# Patient Record
Sex: Female | Born: 1975 | Race: White | Hispanic: No | Marital: Married | Smoking: Never smoker
Health system: Southern US, Community
[De-identification: ages and names within clinical notes are randomized; demographics above are authoritative.]

## PROBLEM LIST (undated history)

## (undated) DIAGNOSIS — R51 Headache: Secondary | ICD-10-CM

## (undated) DIAGNOSIS — R519 Headache, unspecified: Secondary | ICD-10-CM

## (undated) HISTORY — DX: Headache, unspecified: R51.9

## (undated) HISTORY — DX: Headache: R51

---

## 2007-05-29 ENCOUNTER — Ambulatory Visit: Payer: Self-pay | Admitting: Internal Medicine

## 2007-11-27 ENCOUNTER — Ambulatory Visit: Payer: Self-pay | Admitting: Internal Medicine

## 2015-11-28 ENCOUNTER — Ambulatory Visit (INDEPENDENT_AMBULATORY_CARE_PROVIDER_SITE_OTHER): Payer: Managed Care, Other (non HMO) | Admitting: Primary Care

## 2015-11-28 ENCOUNTER — Encounter: Payer: Self-pay | Admitting: Primary Care

## 2015-11-28 ENCOUNTER — Telehealth: Payer: Self-pay | Admitting: Primary Care

## 2015-11-28 VITALS — BP 120/78 | HR 103 | Temp 98.3°F | Ht 68.0 in | Wt 162.8 lb

## 2015-11-28 DIAGNOSIS — J3489 Other specified disorders of nose and nasal sinuses: Secondary | ICD-10-CM | POA: Diagnosis not present

## 2015-11-28 DIAGNOSIS — R51 Headache: Secondary | ICD-10-CM | POA: Diagnosis not present

## 2015-11-28 DIAGNOSIS — R519 Headache, unspecified: Secondary | ICD-10-CM | POA: Insufficient documentation

## 2015-11-28 NOTE — Progress Notes (Signed)
Subjective:    Patient ID: Eileen Delgado, female    DOB: 02-21-1976, 40 y.o.   MRN: [id number]  HPI  Eileen Delgado is a 40 year old female who presents today to establish care and discuss the problems mentioned below. Will obtain old records.  1) Recurrent Sinusitis: Ongoing since February 2017. Symptoms initially began in February, improved, then returned in March without dissipation. Symptoms include postnasal drip, right sided jaw pain, sinus pain to right frontal and maxillary sinuses, and headaches.   She was evaluated in March 2017 at an urgent care and was provided with a prescription for levofloxacin. She never filled the left levofloxacin as she was reticent after reading the possible side effects. She was then provided with a prescription for Azithromycin for which she completed. Her symptoms returned 2 weeks later. She was then provided with a prescription for Cephalexin and developed rash, scabbing, and shortness of breath.   She's been taking Mucinex, nasal steroid spray, and sudafed with temporary improvement. Denies fevers, chills, nausea, fatigue. Her symptoms have been quite debilitating and have caused anxiety and stress. She is very frustrated with the lack of improvement as she has no prior history of sinus infections/pressure.  2) Frequency Headaches/Migraines: Present for the past 2 years. Will experience migraines during ovulation or several days prior to her menstrual cycle. Since February she's experienced increased headaches to her right temporal and frontal region. She will have occasional photophobia with nausea. Overall her migraines associated with her menstrual cycle are manageable.  Review of Systems  Constitutional: Negative for fever and chills.  HENT: Positive for congestion, postnasal drip and sinus pressure. Negative for ear pain and sore throat.   Respiratory: Negative for cough and shortness of breath.   Cardiovascular: Negative for chest pain.    Gastrointestinal: Negative for nausea.  Skin: Negative for rash.  Allergic/Immunologic: Negative for environmental allergies.  Neurological:       Occasional dizziness with headaches  Psychiatric/Behavioral: The patient is not nervous/anxious.        Past Medical History  Diagnosis Date  . Frequent headaches      Social History   Social History  . Marital Status: Married    Spouse Name: N/A  . Number of Children: N/A  . Years of Education: N/A   Occupational History  . Not on file.   Social History Main Topics  . Smoking status: Never Smoker   . Smokeless tobacco: Not on file  . Alcohol Use: No  . Drug Use: No  . Sexual Activity: Not on file   Other Topics Concern  . Not on file   Social History Narrative   Married   1 son.   Works from home.   Enjoys hiking, photography, blogging.    No past surgical history on file.  No family history on file.  Allergies  Allergen Reactions  . Cephalosporins Rash    Scabs, shortness of breath  . Penicillins Hives  . Phenergan [Promethazine]     No current outpatient prescriptions on file prior to visit.   No current facility-administered medications on file prior to visit.    BP 120/78 mmHg  Pulse 103  Temp(Src) 98.3 F (36.8 C) (Oral)  Ht 5\' 8"  (1.727 m)  Wt 162 lb 12.8 oz (73.846 kg)  BMI 24.76 kg/m2  SpO2 99%  LMP 11/24/2015    Objective:   Physical Exam  Constitutional: She appears well-nourished.  HENT:  Right Ear: Ear canal normal. Tympanic membrane is not  erythematous.  Left Ear: Ear canal normal. Tympanic membrane is not erythematous.  Nose: Mucosal edema present. Right sinus exhibits maxillary sinus tenderness and frontal sinus tenderness. Left sinus exhibits no maxillary sinus tenderness and no frontal sinus tenderness.  Mouth/Throat: Oropharynx is clear and moist.  Dullness to bilateral TM's.  Neck: Neck supple.  Cardiovascular: Normal rate and regular rhythm.   Pulmonary/Chest: Effort  normal and breath sounds normal.  Skin: Skin is warm and dry.  Psychiatric: She has a normal mood and affect.  Appears anxious about ongoing sinus pressure.          Assessment & Plan:

## 2015-11-28 NOTE — Patient Instructions (Signed)
Stop by the front desk and speak with either Shirlee LimerickMarion or Revonda StandardAllison regarding your CT scan and referral to ENT.  Start Flonase nasal spray. Instill 1 spray in each nostril twice daily.  Start Allegra antihistamine daily. Take 1 tablet by mouth once daily.  Please schedule a physical with me before the end of the year. You may also schedule a lab only appointment 3-4 days prior. We will discuss your lab results in detail during your physical.  It was a pleasure to meet you today! Please don't hesitate to call me with any questions. Welcome to [facility]!

## 2015-11-28 NOTE — Assessment & Plan Note (Addendum)
Fairly constant since February 2017. Treatment with 2 different antibiotic regimens since without significant improvement or resolve. No improvement with OTC Sudafed, Mucinex, Flonase. Will have her start daily antihistamine and daily Flonase use. She would like to see ENT for further evaluation. Discussed that it may take several weeks to a month prior to evaluation and to start with antihistamine and Flonase daily for now.  Update:ENT Referral placed and patient able to get in later this week which was unexpected. CT sinus scan canceled at this time, will allow ENT to order if necessary.

## 2015-11-28 NOTE — Progress Notes (Signed)
Pre visit review using our clinic review tool, if applicable. No additional management support is needed unless otherwise documented below in the visit note. 

## 2015-11-28 NOTE — Telephone Encounter (Signed)
Please notify Eileen Delgado that we will hold off on the CT scan for now since she was able to get in with ENT this week. We will let ENT order what they believe to be necessary.

## 2015-11-28 NOTE — Assessment & Plan Note (Signed)
Present during menstrual cycle mostly and overall manageable. Increased headaches since development of sinus pressure in February. Suspect sinus pressure is aggravating headaches, will address sinus pressure in hopes of reducing headache frequency.

## 2015-11-29 ENCOUNTER — Encounter: Payer: Self-pay | Admitting: Primary Care

## 2015-11-29 NOTE — Telephone Encounter (Signed)
Spoken and notified patient of Kate's comments. Patient verbalized understanding. 

## 2015-11-30 ENCOUNTER — Inpatient Hospital Stay: Admission: RE | Admit: 2015-11-30 | Payer: Managed Care, Other (non HMO) | Source: Ambulatory Visit

## 2015-11-30 ENCOUNTER — Other Ambulatory Visit: Payer: Self-pay | Admitting: Primary Care

## 2015-12-03 ENCOUNTER — Other Ambulatory Visit: Payer: Self-pay | Admitting: Otolaryngology

## 2015-12-03 DIAGNOSIS — R52 Pain, unspecified: Secondary | ICD-10-CM

## 2015-12-03 DIAGNOSIS — R0981 Nasal congestion: Secondary | ICD-10-CM

## 2015-12-07 ENCOUNTER — Ambulatory Visit
Admission: RE | Admit: 2015-12-07 | Discharge: 2015-12-07 | Disposition: A | Discharge status: Home / Self Care | Payer: Managed Care, Other (non HMO) | Source: Ambulatory Visit | Attending: Otolaryngology | Admitting: Otolaryngology

## 2015-12-07 DIAGNOSIS — R52 Pain, unspecified: Secondary | ICD-10-CM

## 2015-12-07 DIAGNOSIS — R0981 Nasal congestion: Secondary | ICD-10-CM

## 2015-12-09 ENCOUNTER — Encounter: Payer: Self-pay | Admitting: Primary Care

## 2015-12-14 ENCOUNTER — Ambulatory Visit: Payer: Managed Care, Other (non HMO) | Admitting: Primary Care

## 2015-12-14 ENCOUNTER — Ambulatory Visit (INDEPENDENT_AMBULATORY_CARE_PROVIDER_SITE_OTHER): Payer: Managed Care, Other (non HMO) | Admitting: Primary Care

## 2015-12-14 ENCOUNTER — Encounter: Payer: Self-pay | Admitting: Primary Care

## 2015-12-14 VITALS — BP 122/82 | HR 91 | Temp 98.8°F | Ht 68.0 in | Wt 171.8 lb

## 2015-12-14 DIAGNOSIS — J3489 Other specified disorders of nose and nasal sinuses: Secondary | ICD-10-CM | POA: Diagnosis not present

## 2015-12-14 DIAGNOSIS — R51 Headache: Secondary | ICD-10-CM | POA: Diagnosis not present

## 2015-12-14 DIAGNOSIS — R519 Headache, unspecified: Secondary | ICD-10-CM

## 2015-12-14 MED ORDER — METHYLPREDNISOLONE ACETATE 80 MG/ML IJ SUSP
80.0000 mg | Freq: Once | INTRAMUSCULAR | Status: AC
  Administered 2015-12-14: 80 mg via INTRAMUSCULAR

## 2015-12-14 NOTE — Assessment & Plan Note (Signed)
Overall improved for sinus pressure, sinus CT scan unremarkable. Highly suspect migraines to be cause but due to extreme tenderness to right temporal region with radiation to jaw and right neck will rule out GCA. Labs pending for sedimentation rate, CRP, CBC.  If unremarkable will treat headaches with daily preventative. IM Depo-Medrol provided today in hopes of reducing inflammation and discomfort to temporal region and headache.

## 2015-12-14 NOTE — Progress Notes (Signed)
Pre visit review using our clinic review tool, if applicable. No additional management support is needed unless otherwise documented below in the visit note. 

## 2015-12-14 NOTE — Progress Notes (Signed)
   Subjective:    Patient ID: Eileen Delgado, female    DOB: Sep 03, 1975, 40 y.o.   MRN: [id number]  HPI  [name] is a 40 year old female who presents today for follow up of headaches and sinus pressure. She was evaluated as a new patient on 07/17 with complaints of sinus pressure and headaches that had been present since February 2017. Last visit she was sent to ENT for further evaluation as she had no improvement with OTC and antibiotic treatment. She was evaluated by ENT who completed a sinus CT which was unremarkable.  Since her last visit her sinus pressure has improved. She continues to experience headaches to the right temporal region with radiation down to the right jaw, and then to her right neck. She will experience intermittent numbness/tingling. She's experiencing severe tenderness to the right temporal region with even the lightest amount of palpation.   She's been using lidocaine patches with improvement in jaw pain. She will experience photophobia, phonophobia, nausea. She's been taking Allegra and Flonase with some improvement. She's taken tylenol and ibuprofen in the past without improvement. She describes her pain as dull, pressure.  Review of Systems  Constitutional: Negative for fever.  Eyes: Positive for photophobia.  Gastrointestinal: Positive for nausea.  Musculoskeletal:       Right-sided jaw pain.  Skin: Negative for color change.  Neurological: Positive for numbness and headaches.       Past Medical History:  Diagnosis Date  . Frequent headaches      Social History   Social History  . Marital status: Married    Spouse name: N/A  . Number of children: N/A  . Years of education: N/A   Occupational History  . Not on file.   Social History Main Topics  . Smoking status: Never Smoker  . Smokeless tobacco: Not on file  . Alcohol use No  . Drug use: No  . Sexual activity: Not on file   Other Topics Concern  . Not on file   Social History Narrative    Married   1 son.   Works from home.   Enjoys hiking, photography, blogging.    No past surgical history on file.  No family history on file.  Allergies  Allergen Reactions  . Cephalosporins Rash    Scabs, shortness of breath  . Penicillins Hives  . Phenergan [Promethazine]     No current outpatient prescriptions on file prior to visit.   No current facility-administered medications on file prior to visit.     BP 122/82   Pulse 91   Temp 98.8 F (37.1 C) (Oral)   Ht 5\' 8"  (1.727 m)   Wt 171 lb 12.8 oz (77.9 kg)   LMP 11/24/2015   SpO2 98%   BMI 26.12 kg/m    Objective:   Physical Exam  Constitutional: She is oriented to person, place, and time. She appears well-nourished.  Eyes: Pupils are equal, round, and reactive to light.  Neck: Neck supple.  Cardiovascular: Normal rate and regular rhythm.   Pulmonary/Chest: Effort normal and breath sounds normal.  Neurological: She is alert and oriented to person, place, and time. No cranial nerve deficit.  Skin: Skin is warm and dry.  Very tender to right temporal region light touch.  Psychiatric: She has a normal mood and affect.          Assessment & Plan:

## 2015-12-14 NOTE — Patient Instructions (Signed)
Complete lab work prior to leaving today. I will notify you of your results once received.   You were provided with an injection of steroids to help reduce pain, inflammation, and numbness.  Please e-mail me in 48 hours with an update.   I will be in touch as soon as I receive these lab results.   It was a pleasure to see you today!

## 2015-12-14 NOTE — Assessment & Plan Note (Signed)
Improved since last visit, ENT ruled out sinus involvement.

## 2015-12-15 ENCOUNTER — Encounter: Payer: Self-pay | Admitting: Primary Care

## 2015-12-15 ENCOUNTER — Other Ambulatory Visit: Payer: Self-pay | Admitting: Primary Care

## 2015-12-15 DIAGNOSIS — R519 Headache, unspecified: Secondary | ICD-10-CM

## 2015-12-15 DIAGNOSIS — R51 Headache: Principal | ICD-10-CM

## 2015-12-15 DIAGNOSIS — G43901 Migraine, unspecified, not intractable, with status migrainosus: Secondary | ICD-10-CM

## 2015-12-15 LAB — CBC
HEMATOCRIT: 39.7 % (ref 36.0–46.0)
Hemoglobin: 13.4 g/dL (ref 12.0–15.0)
MCHC: 33.6 g/dL (ref 30.0–36.0)
MCV: 92 fl (ref 78.0–100.0)
Platelets: 201 10*3/uL (ref 150.0–400.0)
RBC: 4.32 Mil/uL (ref 3.87–5.11)
RDW: 12.6 % (ref 11.5–15.5)
WBC: 8 10*3/uL (ref 4.0–10.5)

## 2015-12-15 LAB — C-REACTIVE PROTEIN

## 2015-12-15 LAB — SEDIMENTATION RATE: SED RATE: 14 mm/h (ref 0–20)

## 2015-12-15 MED ORDER — SUMATRIPTAN SUCCINATE 50 MG PO TABS: ORAL_TABLET | ORAL | 5 refills | Status: AC

## 2015-12-15 MED ORDER — TOPIRAMATE 25 MG PO TABS: 25.0000 mg | ORAL_TABLET | Freq: Every day | ORAL | 1 refills | Status: AC

## 2015-12-19 ENCOUNTER — Other Ambulatory Visit: Payer: Self-pay | Admitting: Primary Care

## 2015-12-19 DIAGNOSIS — R51 Headache: Principal | ICD-10-CM

## 2015-12-19 DIAGNOSIS — R519 Headache, unspecified: Secondary | ICD-10-CM

## 2015-12-19 MED ORDER — LIDOCAINE 5 % EX PTCH: 1.0000 | MEDICATED_PATCH | CUTANEOUS | 0 refills | Status: DC

## 2015-12-20 ENCOUNTER — Encounter: Payer: Self-pay | Admitting: Primary Care

## 2015-12-29 ENCOUNTER — Telehealth: Payer: Self-pay | Admitting: Primary Care

## 2015-12-29 NOTE — Telephone Encounter (Signed)
Tried to call paitent but could not leave message. Phone kept ringing.  Sent patient a MyChart message regarding Kate's comments.

## 2015-12-29 NOTE — Telephone Encounter (Addendum)
-----   Message from Doreene NestKatherine K Belvie Iribe, NP sent at 12/15/2015  5:28 PM EDT ----- Regarding: Migraines Please check on patient's headaches/migraines. How she feeling on Topamax?

## 2016-01-02 ENCOUNTER — Other Ambulatory Visit: Payer: Self-pay | Admitting: Primary Care

## 2016-01-02 ENCOUNTER — Encounter: Payer: Self-pay | Admitting: Primary Care

## 2016-01-02 DIAGNOSIS — M62838 Other muscle spasm: Secondary | ICD-10-CM

## 2016-01-02 MED ORDER — CYCLOBENZAPRINE HCL 5 MG PO TABS: 5.0000 mg | ORAL_TABLET | Freq: Three times a day (TID) | ORAL | 0 refills | Status: AC | PRN

## 2016-01-02 NOTE — Telephone Encounter (Signed)
Replied to patient via MyChart. 

## 2018-04-26 IMAGING — CT CT PARANASAL SINUSES LIMITED
1 series · 9 of 11 positions shown, 12 images · non-contrast
Comparison: None.

CLINICAL DATA: Right-sided facial pain and pressure. Increased
tenderness over the right temple.

EXAM:
CT PARANASAL SINUS LIMITED WITHOUT CONTRAST
TECHNIQUE: Non-contiguous multidetector CT images of the paranasal sinuses were
obtained in a single plane without contrast.

[Series 3: coronal soft · axial · 0.33mm/px · z∈[+7,+87]mm · 9 of 11 slices shown, 12 images]
[im 2/11  brain]
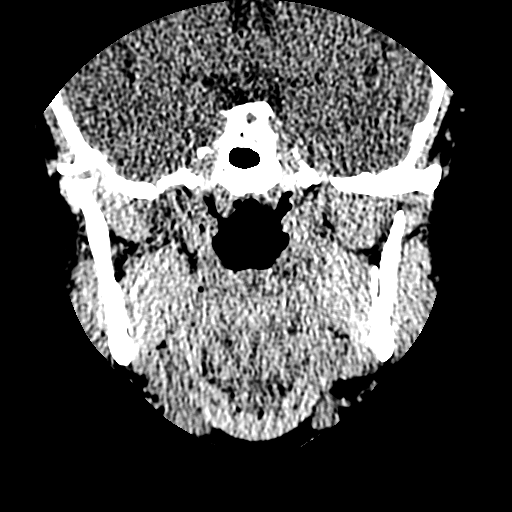
[im 2/11  bone]
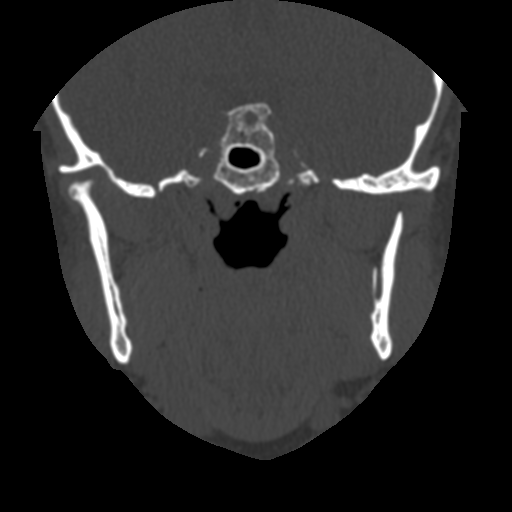
[im 3/11  bone]
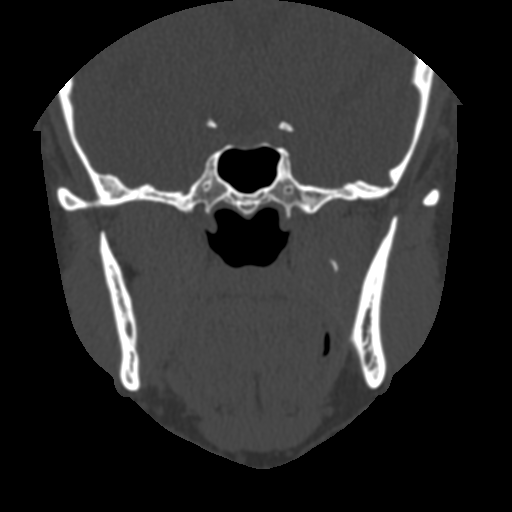
[im 4/11  bone]
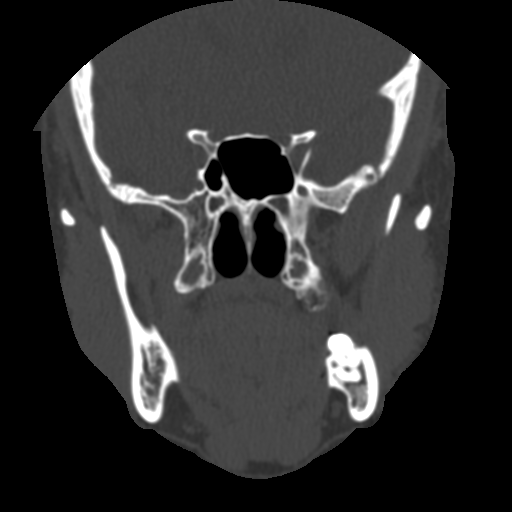
[im 5/11  bone]
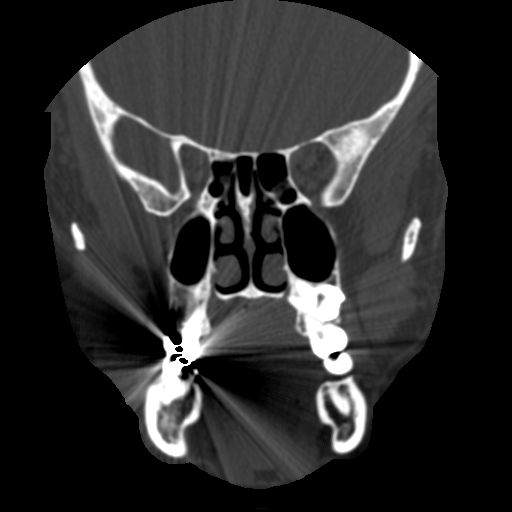
[im 6/11  brain]
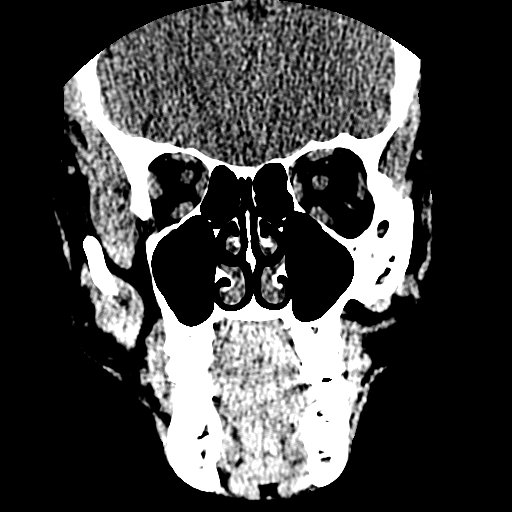
[im 6/11  bone]
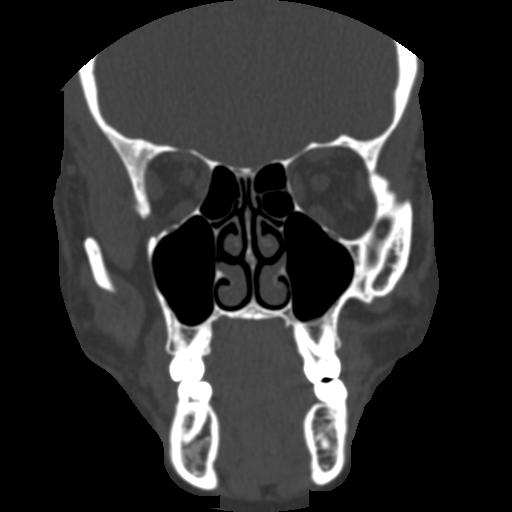
[im 7/11  bone]
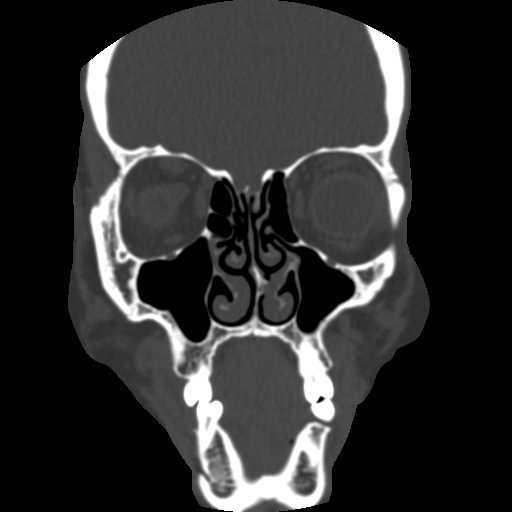
[im 8/11  bone]
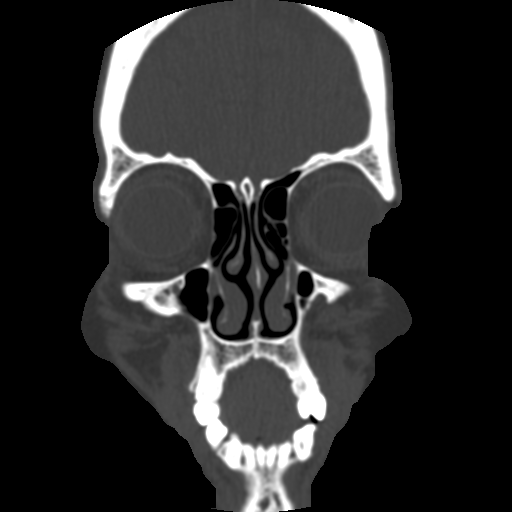
[im 9/11  bone]
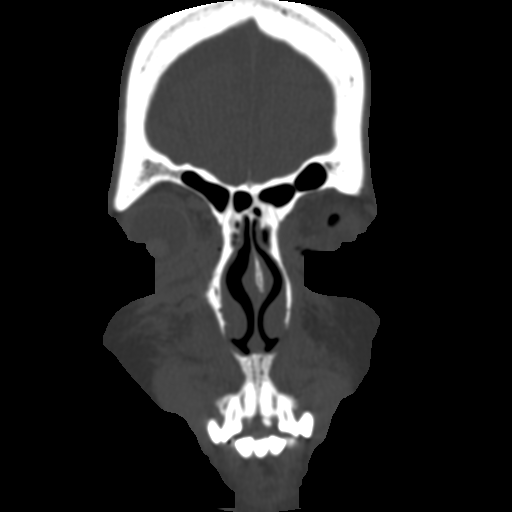
[im 10/11  brain]
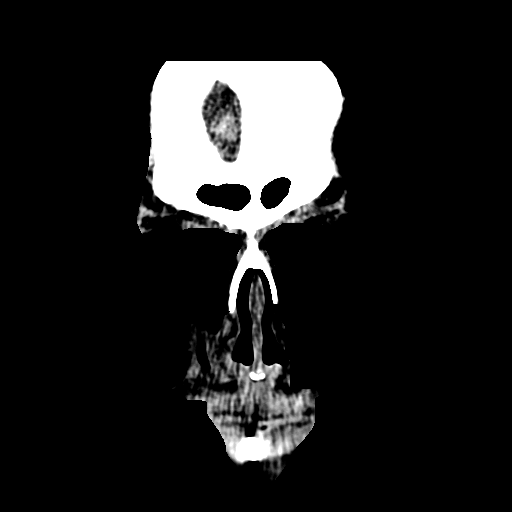
[im 10/11  bone]
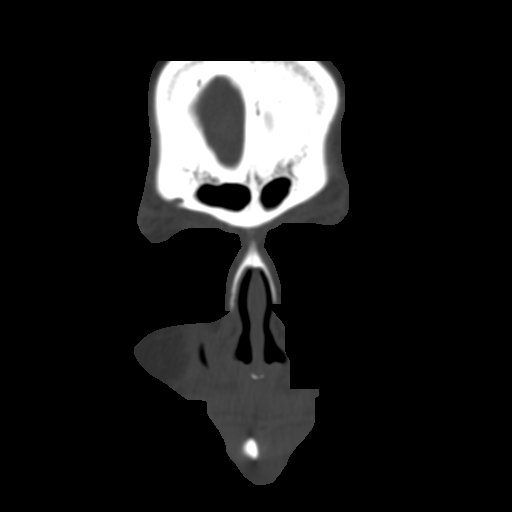

[9 of 11 positions shown; findings below may reference images not displayed]

FINDINGS: Screening examination demonstrates patency of the paranasal sinuses.
No focal mucosal thickening or fluid levels are present. The nasal
cavity is clear. Limited imaging of the brain is unremarkable.
IMPRESSION: Negative limited sinus CT.

## 2021-05-06 ENCOUNTER — Emergency Department (HOSPITAL_BASED_OUTPATIENT_CLINIC_OR_DEPARTMENT_OTHER)
Admission: EM | Admit: 2021-05-06 | Discharge: 2021-05-06 | Disposition: A | Discharge status: Home / Self Care | Payer: BC Managed Care – PPO | Attending: Emergency Medicine | Admitting: Emergency Medicine

## 2021-05-06 ENCOUNTER — Other Ambulatory Visit: Payer: Self-pay

## 2021-05-06 ENCOUNTER — Encounter (HOSPITAL_BASED_OUTPATIENT_CLINIC_OR_DEPARTMENT_OTHER): Payer: Self-pay

## 2021-05-06 DIAGNOSIS — M542 Cervicalgia: Secondary | ICD-10-CM | POA: Diagnosis present

## 2021-05-06 DIAGNOSIS — J029 Acute pharyngitis, unspecified: Secondary | ICD-10-CM | POA: Diagnosis not present

## 2021-05-06 LAB — GROUP A STREP BY PCR: Group A Strep by PCR: NOT DETECTED

## 2021-05-06 NOTE — Discharge Instructions (Addendum)
You are seen in the emergency department today for neck pain and sore throat.  Your strep test was negative.  The symptoms that I normally look for with meningitis include but are not limited to headache, fever, neck stiffness, or change in her your mental status.  I think that your symptoms are most likely related to a viral illness.  I normally recommend over-the-counter medications like ibuprofen, Tylenol, or Aleve as needed.  We discussed if you continue to have difficulty swallowing I like you to make an appointment with a gastroenterologist, and I have attached their contact information free to call make an appointment.  At that point you all can discuss if there is any further investigation needed.    Continue to monitor how you're doing and return to the ER for new or worsening symptoms such as fevers, chills, dizziness, difficulty walking, vomiting, or confusion.  It has been a pleasure seeing and caring for you today and I hope you start feeling better soon!

## 2021-05-06 NOTE — ED Triage Notes (Signed)
Pt states Wednesday she has had neck pain that has gotten worse up until yesterday. Pain is when moving head side to side, as well as up and down. . Denies fever. Pt states pain with swallowing.

## 2021-05-06 NOTE — ED Provider Notes (Signed)
[facility] EMERGENCY DEPT Provider Note   CSN: [id number] Arrival date & time: 05/06/21  1448     History Chief Complaint  Patient presents with   Neck Pain   Sore Throat    Eileen Delgado is a 45 y.o. female with history of frequent headaches who presents emergency department complaining of neck pain and sore throat.  She states that when she woke up about 4 days ago, she had bilateral neck pain, that is gotten worse.  Her pain is made worse with any kind of movement of her neck.  She is also having sore throat, and some pain with swallowing, but is overall tolerating p.o.  She denies fevers, chills, cough, congestion, headache, vision changes, dizziness, or changes in her gait.   Neck Pain Associated symptoms: no chest pain, no fever, no headaches, no numbness and no weakness   Sore Throat Pertinent negatives include no chest pain, no abdominal pain, no headaches and no shortness of breath.      Past Medical History:  Diagnosis Date   Frequent headaches     Patient Active Problem List   Diagnosis Date Noted   Sinus pressure 11/28/2015   Frequent headaches 11/28/2015    History reviewed. No pertinent surgical history.   OB History   No obstetric history on file.     History reviewed. No pertinent family history.  Social History   Tobacco Use   Smoking status: Never  Substance Use Topics   Alcohol use: No    Alcohol/week: 0.0 standard drinks   Drug use: No    Home Medications Prior to Admission medications   Medication Sig Start Date End Date Taking? Authorizing Provider  cyclobenzaprine (FLEXERIL) 5 MG tablet Take 1 tablet (5 mg total) by mouth 3 (three) times daily as needed for muscle spasms (headaches). 01/02/16   Doreene Nest, NP  SUMAtriptan (IMITREX) 50 MG tablet Take 1 tablet by mouth at migraine onset. May repeat in 2 hours if no resolve. Do not exceed 2 tablets in 24 hours. 12/15/15   Doreene Nest, NP  topiramate (TOPAMAX)  25 MG tablet Take 1 tablet (25 mg total) by mouth at bedtime. 12/15/15   Doreene Nest, NP    Allergies    Cephalosporins, Penicillins, and Phenergan [promethazine]  Review of Systems   Review of Systems  Constitutional:  Negative for chills and fever.  HENT:  Positive for sore throat. Negative for congestion and trouble swallowing.   Respiratory:  Negative for cough and shortness of breath.   Cardiovascular:  Negative for chest pain.  Gastrointestinal:  Negative for abdominal pain, constipation, diarrhea, nausea and vomiting.  Musculoskeletal:  Positive for neck pain. Negative for gait problem.  Neurological:  Negative for dizziness, weakness, numbness and headaches.  All other systems reviewed and are negative.  Physical Exam Updated Vital Signs BP 129/86 (BP Location: Right Arm)    Pulse 100    Temp 99.4 F (37.4 C) (Oral)    Resp 16    Ht 5\' 8"  (1.727 m)    Wt 83.9 kg    LMP 05/06/2021    SpO2 100%    BMI 28.13 kg/m   Physical Exam Vitals and nursing note reviewed.  Constitutional:      Appearance: Normal appearance.  HENT:     Head: Normocephalic and atraumatic.     Nose: No congestion.     Mouth/Throat:     Mouth: Mucous membranes are moist.     Pharynx: Posterior  oropharyngeal erythema present. No oropharyngeal exudate.  Eyes:     Conjunctiva/sclera: Conjunctivae normal.  Cardiovascular:     Rate and Rhythm: Normal rate and regular rhythm.  Pulmonary:     Effort: Pulmonary effort is normal. No respiratory distress.     Breath sounds: Normal breath sounds.  Abdominal:     General: There is no distension.     Palpations: Abdomen is soft.     Tenderness: There is no abdominal tenderness.  Musculoskeletal:     Cervical back: Normal range of motion and neck supple.  Lymphadenopathy:     Cervical: No cervical adenopathy.  Skin:    General: Skin is warm and dry.  Neurological:     General: No focal deficit present.     Mental Status: She is alert.     Gait:  Gait is intact.     Comments: Neuro: Speech is clear, able to follow commands. CN III-XII intact grossly intact. PERRLA. EOMI. Sensation intact throughout. Str 5/5 all extremities.  Negative brudzinki and Kernig signs    ED Results / Procedures / Treatments   Labs (all labs ordered are listed, but only abnormal results are displayed) Labs Reviewed  GROUP A STREP BY PCR    EKG None  Radiology No results found.  Procedures Procedures   Medications Ordered in ED Medications - No data to display  ED Course  I have reviewed the triage vital signs and the nursing notes.  Pertinent labs & imaging results that were available during my care of the patient were reviewed by me and considered in my medical decision making (see chart for details).    MDM Rules/Calculators/A&P                           Patient is an otherwise healthy 45 year old female presents emergency department complaining of neck pain and sore throat for 4 days. She reports some difficulty swallowing due to pain, but is overall tolerating PO.   On my exam patient is afebrile, not tachycardic, not hypoxic, and in no acute distress. There is mild erythema in the posterior oropharynx, without edema or exudate.  Neck is supple, with normal passive ROM. Normal neurologic exam as above. Negative Brudzinki and kernig's signs. I have low concern for meningitis at this time. Strep testing is negative.   Discussed with patient symptoms most likely related to viral pharyngitis. Discussed that if difficulty swallowing progresses in any way, she may need referral to GI for endoscopy. Overall patient is well appearing, and vital signs are reassuring. Will treat symptomatically with over the counter medications. Discussed reasons to return to ED. Patient agreeable to plan.   Final Clinical Impression(s) / ED Diagnoses Final diagnoses:  Sore throat  Neck pain    Rx / DC Orders ED Discharge Orders     None      Portions of  this report may have been transcribed using voice recognition software. Every effort was made to ensure accuracy; however, inadvertent computerized transcription errors may be present.    Jeanella Flattery 05/06/21 1747    Glendora Score, MD 05/07/21 [phone]
